# Patient Record
Sex: Male | Born: 1945 | Hispanic: Refuse to answer | Marital: Married | Smoking: Former smoker
Health system: Southern US, Community
[De-identification: ages and names within clinical notes are randomized; demographics above are authoritative.]

## PROBLEM LIST (undated history)

## (undated) DIAGNOSIS — I10 Essential (primary) hypertension: Secondary | ICD-10-CM

## (undated) DIAGNOSIS — E119 Type 2 diabetes mellitus without complications: Secondary | ICD-10-CM

## (undated) HISTORY — PX: CHOLECYSTECTOMY: SHX55

## (undated) HISTORY — DX: Type 2 diabetes mellitus without complications: E11.9

---

## 2015-12-05 ENCOUNTER — Emergency Department (HOSPITAL_COMMUNITY): Payer: Self-pay

## 2015-12-05 ENCOUNTER — Encounter (HOSPITAL_COMMUNITY): Payer: Self-pay

## 2015-12-05 ENCOUNTER — Emergency Department (HOSPITAL_COMMUNITY)
Admission: EM | Admit: 2015-12-05 | Discharge: 2015-12-05 | Disposition: A | Discharge status: Home / Self Care | Payer: Self-pay | Attending: Emergency Medicine | Admitting: Emergency Medicine

## 2015-12-05 DIAGNOSIS — Z87891 Personal history of nicotine dependence: Secondary | ICD-10-CM | POA: Insufficient documentation

## 2015-12-05 DIAGNOSIS — I10 Essential (primary) hypertension: Secondary | ICD-10-CM | POA: Insufficient documentation

## 2015-12-05 NOTE — ED Notes (Signed)
Made Dr. Particia NearingHaviland aware of patient. Verbal order to order head CT. Orders placed.

## 2015-12-05 NOTE — ED Triage Notes (Signed)
Reports of tingling to right side of mouth. STates "it feels like a peppermint candy in the corner of my mouth". Stated today at 1630. States he feels a little off balance.

## 2015-12-05 NOTE — ED Provider Notes (Signed)
[facility]-EM[facility] DEPT Provider Note   CSN: [id number] Arrival date & time: 12/05/15  1930     History   Chief Complaint Chief Complaint  Patient presents with  . Numbness    HPI Tonita PhoenixJehuida Verner is a 70 y.o. male.  Patient complains of a taste appointment candy in the right side of his mouth. He has had hypertension for 40 years without medication. He states he is slightly off balance, but appears to be walking normally. No facial asymmetry, confusion, arm or leg weakness, slurred speech      History reviewed. No pertinent past medical history.  There are no active problems to display for this patient.   Past Surgical History:  Procedure Laterality Date  . CHOLECYSTECTOMY         Home Medications    Prior to Admission medications   Not on File    Family History No family history on file.  Social History Social History  Substance Use Topics  . Smoking status: Former Games developermoker  . Smokeless tobacco: Never Used  . Alcohol use No     Allergies   Review of patient's allergies indicates no known allergies.   Review of Systems Review of Systems  All other systems reviewed and are negative.    Physical Exam Updated Vital Signs BP (!) 170/110 (BP Location: Left Arm)   Pulse 89   Temp 97.8 F (36.6 C) (Oral)   Resp 18   Ht 5\' 8"  (1.727 m)   Wt 175 lb (79.4 kg)   SpO2 98%   BMI 26.61 kg/m   Physical Exam  Constitutional: He is oriented to person, place, and time. He appears well-developed and well-nourished.  HENT:  Head: Normocephalic and atraumatic.  Eyes: Conjunctivae are normal.  Neck: Neck supple.  Cardiovascular: Normal rate and regular rhythm.   Pulmonary/Chest: Effort normal and breath sounds normal.  Abdominal: Soft. Bowel sounds are normal.  Musculoskeletal: Normal range of motion.  Neurological: He is alert and oriented to person, place, and time.  Skin: Skin is warm and dry.  Psychiatric: He has a normal mood and affect. His behavior  is normal.  Nursing note and vitals reviewed.    ED Treatments / Results  Labs (all labs ordered are listed, but only abnormal results are displayed) Labs Reviewed - No data to display  EKG  EKG Interpretation None       Radiology Ct Head Wo Contrast  Result Date: 12/05/2015 CLINICAL DATA:  Acute onset of right-sided mouth tingling. Mild loss of balance. Initial encounter. EXAM: CT HEAD WITHOUT CONTRAST TECHNIQUE: Contiguous axial images were obtained from the base of the skull through the vertex without intravenous contrast. COMPARISON:  None. FINDINGS: Brain: No evidence of acute infarction, hemorrhage, hydrocephalus, extra-axial collection or mass lesion/mass effect. Dense calcification is noted at the basal ganglia bilaterally. Mild periventricular white matter change likely reflects small vessel ischemic microangiopathy. The posterior fossa, including the cerebellum, brainstem and fourth ventricle, is within normal limits. The third and lateral ventricles are unremarkable in appearance. The cerebral hemispheres are symmetric in appearance, with normal gray-white differentiation. No mass effect or midline shift is seen. Vascular: No hyperdense vessel or unexpected calcification. Skull: There is no evidence of fracture; visualized osseous structures are unremarkable in appearance. Sinuses/Orbits: The visualized portions of the orbits are within normal limits. The paranasal sinuses and mastoid air cells are well-aerated. Other: No significant soft tissue abnormalities are seen. IMPRESSION: 1. No acute intracranial pathology seen on CT. 2. Mild small vessel  ischemic microangiopathy. Electronically Signed   By: Roanna RaiderJeffery  Chang M.D.   On: 12/05/2015 20:01    Procedures Procedures (including critical care time)  Medications Ordered in ED Medications - No data to display   Initial Impression / Assessment and Plan / ED Course  I have reviewed the triage vital signs and the nursing  notes.  Pertinent labs & imaging results that were available during my care of the patient were reviewed by me and considered in my medical decision making (see chart for details).  Clinical Course    Patient has normal physical exam. Recommended follow with primary care for his blood pressure. Discussed with patient and his family  Final Clinical Impressions(s) / ED Diagnoses   Final diagnoses:  Hypertension, unspecified type    New Prescriptions New Prescriptions   No medications on file     Donnetta HutchingBrian Natascha Edmonds, MD 12/05/15 2136

## 2015-12-05 NOTE — Discharge Instructions (Signed)
CT scan showed no acute findings. Recommend follow-up with a primary care doctor to monitor your blood pressure.

## 2015-12-05 NOTE — ED Notes (Signed)
To ct

## 2016-03-11 ENCOUNTER — Emergency Department (HOSPITAL_COMMUNITY)
Admission: EM | Admit: 2016-03-11 | Discharge: 2016-03-11 | Disposition: A | Discharge status: Home / Self Care | Payer: Managed Care, Other (non HMO) | Attending: Emergency Medicine | Admitting: Emergency Medicine

## 2016-03-11 ENCOUNTER — Encounter (HOSPITAL_COMMUNITY): Payer: Self-pay | Admitting: Emergency Medicine

## 2016-03-11 DIAGNOSIS — Z87891 Personal history of nicotine dependence: Secondary | ICD-10-CM | POA: Diagnosis not present

## 2016-03-11 DIAGNOSIS — I10 Essential (primary) hypertension: Secondary | ICD-10-CM | POA: Insufficient documentation

## 2016-03-11 DIAGNOSIS — N289 Disorder of kidney and ureter, unspecified: Secondary | ICD-10-CM | POA: Diagnosis not present

## 2016-03-11 DIAGNOSIS — N3001 Acute cystitis with hematuria: Secondary | ICD-10-CM | POA: Insufficient documentation

## 2016-03-11 DIAGNOSIS — R300 Dysuria: Secondary | ICD-10-CM | POA: Diagnosis present

## 2016-03-11 HISTORY — DX: Essential (primary) hypertension: I10

## 2016-03-11 LAB — URINALYSIS, ROUTINE W REFLEX MICROSCOPIC
Bilirubin Urine: NEGATIVE
Glucose, UA: NEGATIVE mg/dL
Ketones, ur: 15 mg/dL — AB
Nitrite: POSITIVE — AB
Protein, ur: 100 mg/dL — AB
Specific Gravity, Urine: 1.02 (ref 1.005–1.030)
pH: 6 (ref 5.0–8.0)

## 2016-03-11 LAB — URINALYSIS, MICROSCOPIC (REFLEX): Squamous Epithelial / LPF: NONE SEEN

## 2016-03-11 LAB — CBC
HCT: 41.9 % (ref 39.0–52.0)
Hemoglobin: 13.7 g/dL (ref 13.0–17.0)
MCH: 27.4 pg (ref 26.0–34.0)
MCHC: 32.7 g/dL (ref 30.0–36.0)
MCV: 83.8 fL (ref 78.0–100.0)
Platelets: 147 10*3/uL — ABNORMAL LOW (ref 150–400)
RBC: 5 MIL/uL (ref 4.22–5.81)
RDW: 14.8 % (ref 11.5–15.5)
WBC: 15.9 10*3/uL — ABNORMAL HIGH (ref 4.0–10.5)

## 2016-03-11 LAB — BASIC METABOLIC PANEL
Anion gap: 7 (ref 5–15)
BUN: 32 mg/dL — ABNORMAL HIGH (ref 6–20)
CO2: 30 mmol/L (ref 22–32)
Calcium: 9.1 mg/dL (ref 8.9–10.3)
Chloride: 99 mmol/L — ABNORMAL LOW (ref 101–111)
Creatinine, Ser: 2.03 mg/dL — ABNORMAL HIGH (ref 0.61–1.24)
GFR calc Af Amer: 36 mL/min — ABNORMAL LOW (ref >60)
GFR calc non Af Amer: 31 mL/min — ABNORMAL LOW (ref >60)
Glucose, Bld: 129 mg/dL — ABNORMAL HIGH (ref 65–99)
Potassium: 4.3 mmol/L (ref 3.5–5.1)
Sodium: 136 mmol/L (ref 135–145)

## 2016-03-11 MED ORDER — LIDOCAINE HCL (PF) 1 % IJ SOLN
INTRAMUSCULAR | Status: AC
  Administered 2016-03-11: 2.1 mL
  Filled 2016-03-11: qty 5

## 2016-03-11 MED ORDER — CEPHALEXIN 500 MG PO CAPS: 500.0000 mg | ORAL_CAPSULE | Freq: Two times a day (BID) | ORAL | 0 refills | Status: DC

## 2016-03-11 MED ORDER — CEFTRIAXONE SODIUM 1 G IJ SOLR
1.0000 g | Freq: Once | INTRAMUSCULAR | Status: AC
  Administered 2016-03-11: 1 g via INTRAMUSCULAR
  Filled 2016-03-11: qty 10

## 2016-03-11 NOTE — ED Triage Notes (Signed)
Patient states blood in urine while voiding and burning sensation. Patient's wife states elevated blood pressure with some altered balance starting yesterday.

## 2016-03-11 NOTE — ED Provider Notes (Signed)
[facility] Provider Note   CSN: [id number] Arrival date & time: 03/11/16  1000  By signing my name below, I, Sonum Patel, attest that this documentation has been prepared under the direction and in the presence of Raeford RazorStephen Lillith Mcneff, MD. Electronically Signed: Sonum Patel, Neurosurgeoncribe. 03/11/16. 11:57 AM.  History   Chief Complaint Chief Complaint  Patient presents with  . Hematuria    The history is provided by the patient. No language interpreter was used.     HPI Comments: Tonita PhoenixJehuida Bache is a 71 y.o. male who presents to the Emergency Department complaining of persistent, unchanged dysuria that has been ongoing for the past few weeks. He reports associated hematuria today and wife notes he was walking as though he was off-balance prior to arrival.  She also reports increased blood pressure for which his wife has given him her own lisinopril. He denies nausea, fatigue.   Past Medical History:  Diagnosis Date  . Hypertension     There are no active problems to display for this patient.   Past Surgical History:  Procedure Laterality Date  . CHOLECYSTECTOMY         Home Medications    Prior to Admission medications   Not on File    Family History No family history on file.  Social History Social History  Substance Use Topics  . Smoking status: Former Games developermoker  . Smokeless tobacco: Never Used  . Alcohol use No     Allergies   Patient has no known allergies.   Review of Systems Review of Systems  A complete 10 system review of systems was obtained and all systems are negative except as noted in the HPI and PMH.   Physical Exam Updated Vital Signs BP 138/77   Pulse 111   Temp 100 F (37.8 C) (Oral)   Resp 20   Ht 5' 8.5" (1.74 m)   Wt 175 lb (79.4 kg)   SpO2 95%   BMI 26.22 kg/m   Physical Exam  Constitutional: He is oriented to person, place, and time. He appears well-developed and well-nourished.  HENT:  Head: Normocephalic and atraumatic.    Eyes: EOM are normal.  Neck: Normal range of motion.  Cardiovascular: Normal rate, regular rhythm, normal heart sounds and intact distal pulses.   Pulmonary/Chest: Effort normal and breath sounds normal. No respiratory distress.  Abdominal: Soft. He exhibits no distension. There is no tenderness. There is no rebound and no guarding.  Musculoskeletal: Normal range of motion.  Neurological: He is alert and oriented to person, place, and time.  Skin: Skin is warm and dry.  Psychiatric: He has a normal mood and affect. Judgment normal.  Nursing note and vitals reviewed.    ED Treatments / Results  DIAGNOSTIC STUDIES: Oxygen Saturation is 95% on RA, adequate by my interpretation.    COORDINATION OF CARE: 11:54 AM Discussed treatment plan with pt at bedside and pt agreed to plan.   Labs (all labs ordered are listed, but only abnormal results are displayed) Labs Reviewed  URINALYSIS, ROUTINE W REFLEX MICROSCOPIC - Abnormal; Notable for the following:       Result Value   APPearance TURBID (*)    Hgb urine dipstick LARGE (*)    Ketones, ur 15 (*)    Protein, ur 100 (*)    Nitrite POSITIVE (*)    Leukocytes, UA LARGE (*)    All other components within normal limits  BASIC METABOLIC PANEL - Abnormal; Notable for the following:  Chloride 99 (*)    Glucose, Bld 129 (*)    BUN 32 (*)    Creatinine, Ser 2.03 (*)    GFR calc non Af Amer 31 (*)    GFR calc Af Amer 36 (*)    All other components within normal limits  CBC - Abnormal; Notable for the following:    WBC 15.9 (*)    Platelets 147 (*)    All other components within normal limits  URINALYSIS, MICROSCOPIC (REFLEX) - Abnormal; Notable for the following:    Bacteria, UA MANY (*)    All other components within normal limits    EKG  EKG Interpretation None       Radiology No results found.  Procedures Procedures (including critical care time)  Medications Ordered in ED Medications - No data to  display   Initial Impression / Assessment and Plan / ED Course  I have reviewed the triage vital signs and the nursing notes.  Pertinent labs & imaging results that were available during my care of the patient were reviewed by me and considered in my medical decision making (see chart for details).      Final Clinical Impressions(s) / ED Diagnoses   Final diagnoses:  Acute cystitis with hematuria  Renal impairment    New Prescriptions New Prescriptions   No medications on file    I personally preformed the services scribed in my presence. The recorded information has been reviewed is accurate. Raeford Razor, MD.    Raeford Razor, MD 03/24/16 1630

## 2016-03-13 LAB — URINE CULTURE: Culture: >=100000 — AB

## 2017-04-26 IMAGING — CT CT HEAD W/O CM
3 series · 15 of 47 positions shown, 18 images · non-contrast
Comparison: None.

CLINICAL DATA: Acute onset of right-sided mouth tingling. Mild loss
of balance. Initial encounter.

EXAM:
CT HEAD WITHOUT CONTRAST
TECHNIQUE: Contiguous axial images were obtained from the base of the skull
through the vertex without intravenous contrast.

[Series 2: head wo · axial · 0.41mm/px · z∈[+57,+182]mm · 9 of 31 slices shown, 12 images]
[im 3/31  brain]
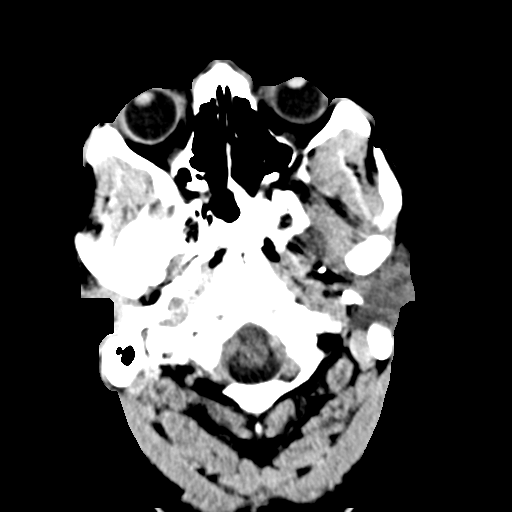
[im 3/31  bone]
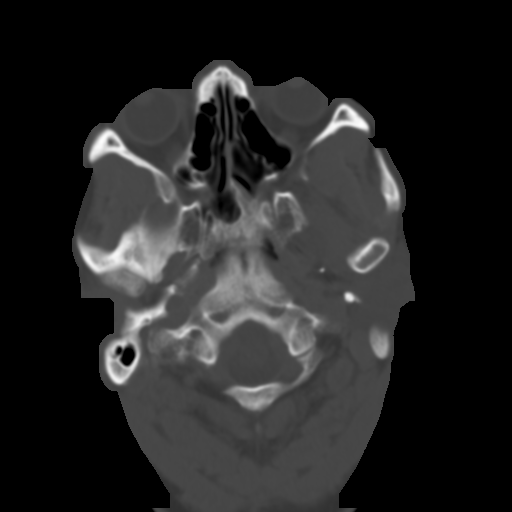
[im 6/31  brain]
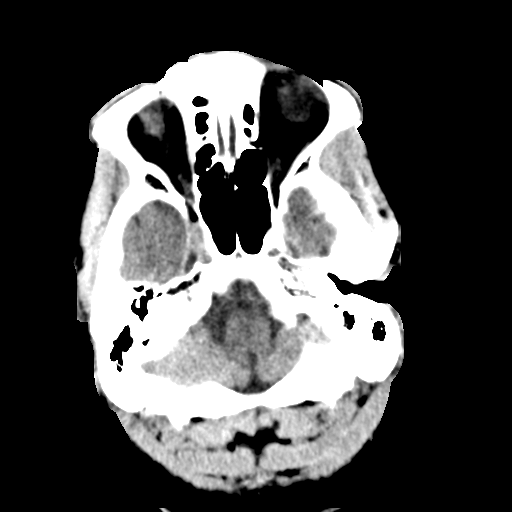
[im 9/31  brain]
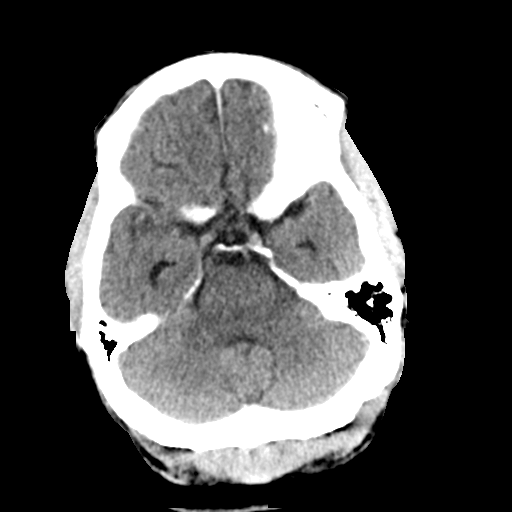
[im 12/31  brain]
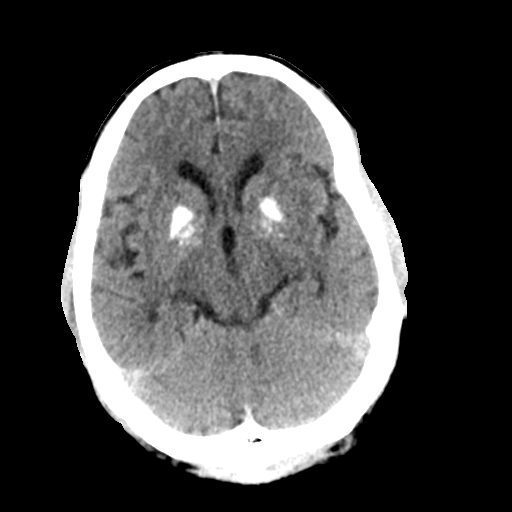
[im 16/31  brain]
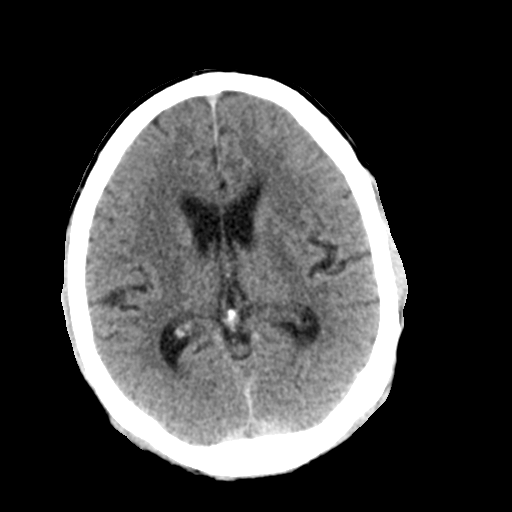
[im 16/31  bone]
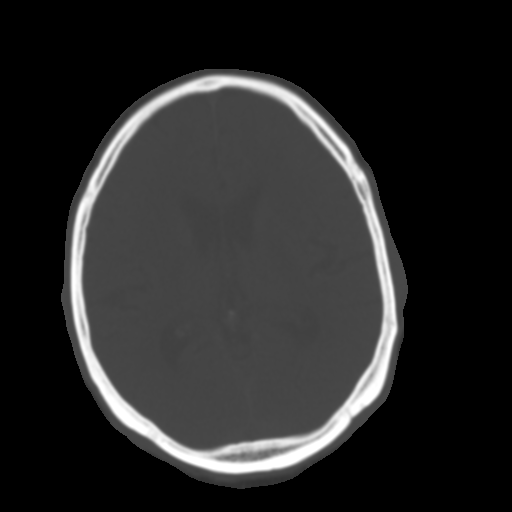
[im 19/31  brain]
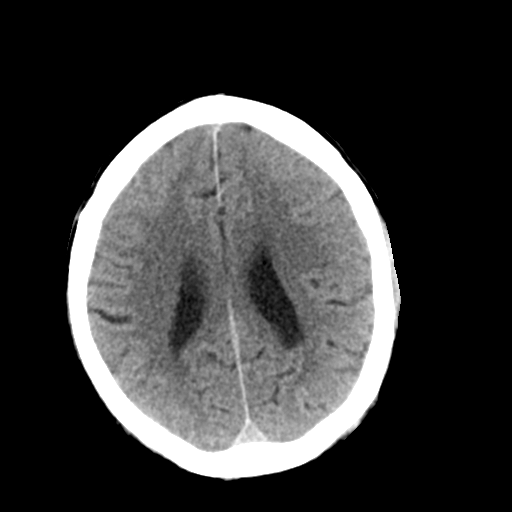
[im 22/31  brain]
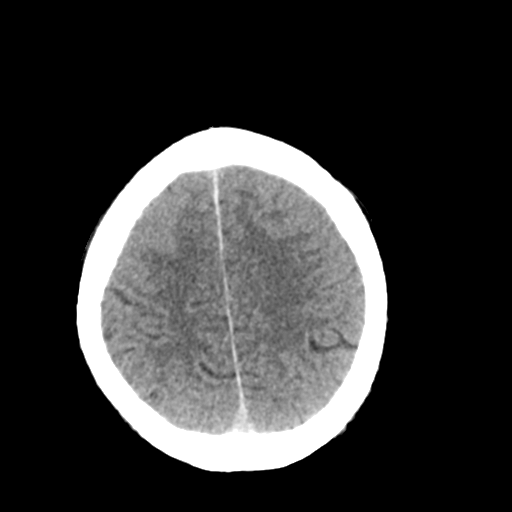
[im 25/31  brain]
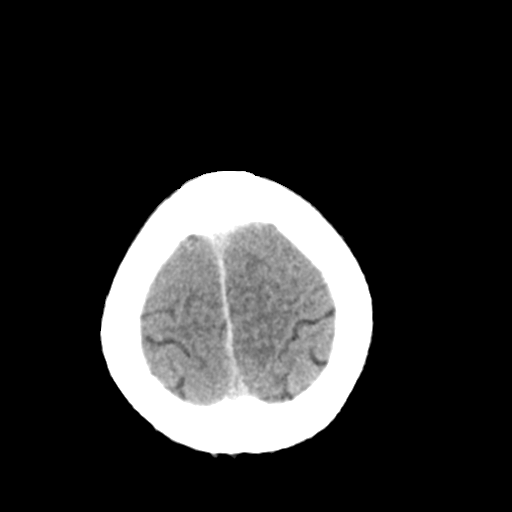
[im 28/31  brain]
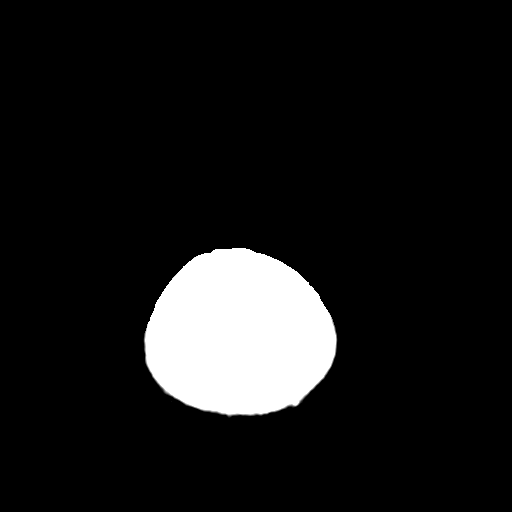
[im 28/31  bone]
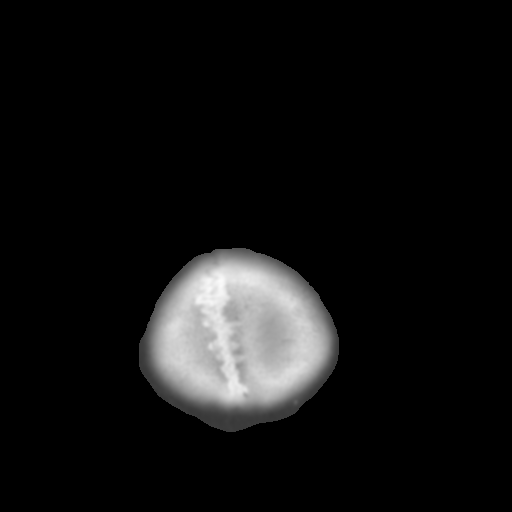

[Series 4: coronal soft tissue · coronal · 0.31mm/px · 3 of 66 slices shown]
[im 22/66  brain]
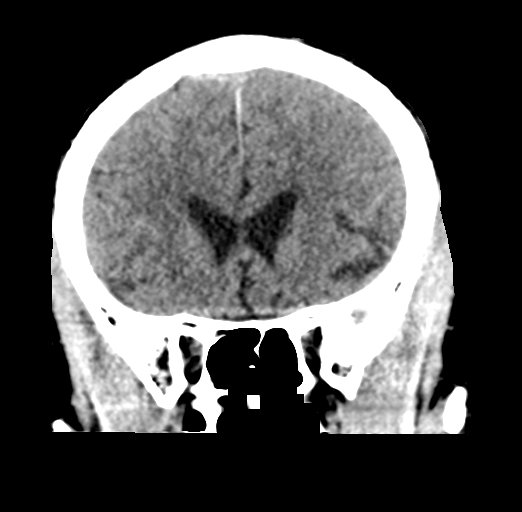
[im 29/66  brain]
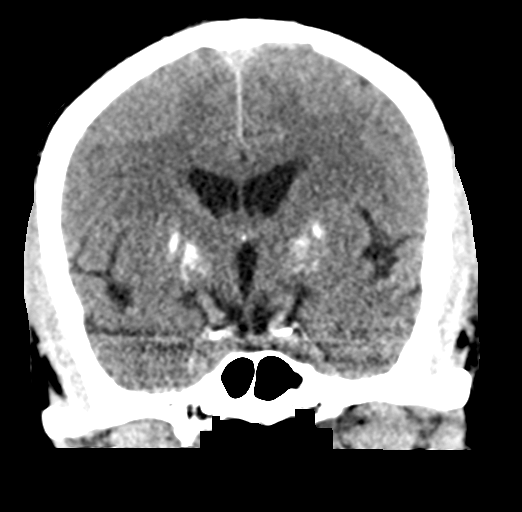
[im 37/66  brain]
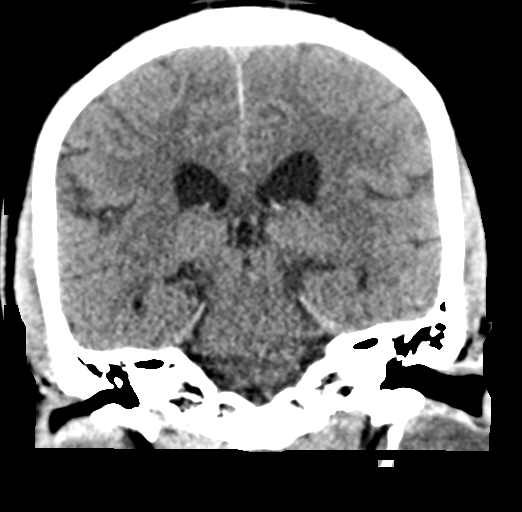

[Series 5: sagittal soft tissue · sagittal · 0.29mm/px · 3 of 54 slices shown]
[im 18/54  brain]
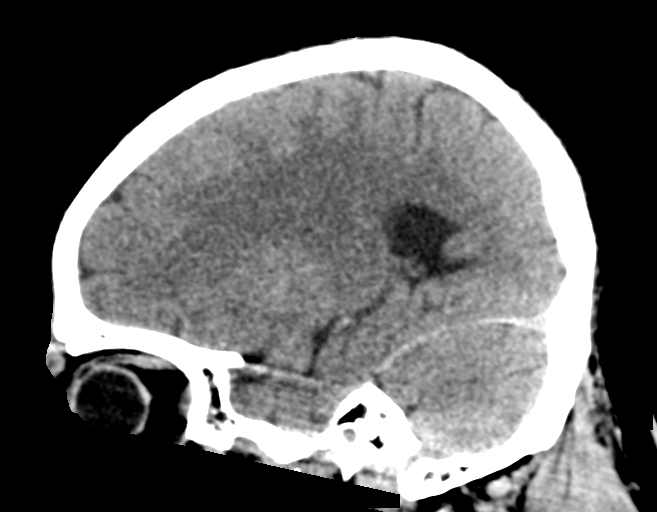
[im 27/54  brain]
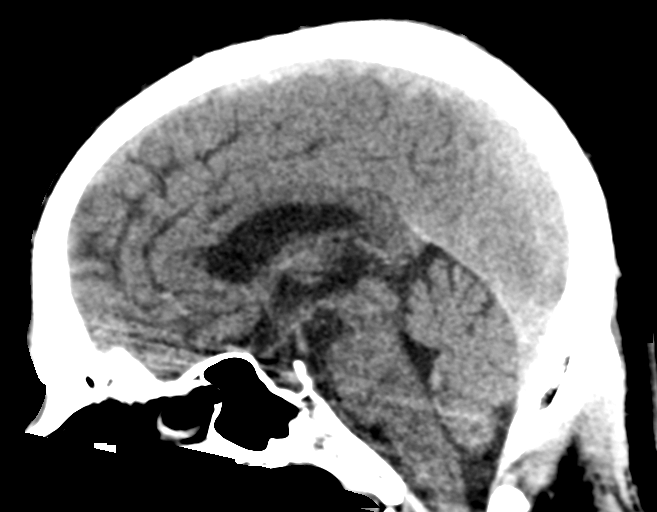
[im 36/54  brain]
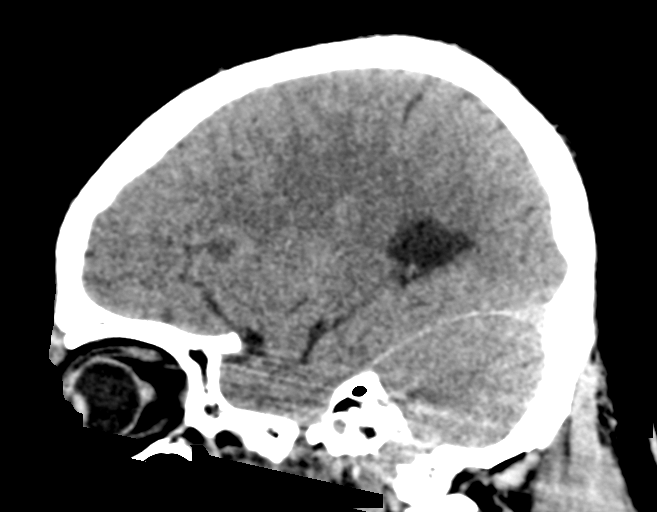

[15 of 47 positions shown; findings below may reference images not displayed]

FINDINGS: Brain: No evidence of acute infarction, hemorrhage, hydrocephalus,
extra-axial collection or mass lesion/mass effect.

Dense calcification is noted at the basal ganglia bilaterally. Mild
periventricular white matter change likely reflects small vessel
ischemic microangiopathy.

The posterior fossa, including the cerebellum, brainstem and fourth
ventricle, is within normal limits. The third and lateral ventricles
are unremarkable in appearance. The cerebral hemispheres are
symmetric in appearance, with normal gray-white differentiation. No
mass effect or midline shift is seen.

Vascular: No hyperdense vessel or unexpected calcification.

Skull: There is no evidence of fracture; visualized osseous
structures are unremarkable in appearance.

Sinuses/Orbits: The visualized portions of the orbits are within
normal limits. The paranasal sinuses and mastoid air cells are
well-aerated.

Other: No significant soft tissue abnormalities are seen.
IMPRESSION: 1. No acute intracranial pathology seen on CT.
2. Mild small vessel ischemic microangiopathy.

## 2018-12-23 ENCOUNTER — Other Ambulatory Visit: Payer: Self-pay

## 2018-12-23 ENCOUNTER — Encounter (HOSPITAL_COMMUNITY): Payer: Self-pay | Admitting: *Deleted

## 2018-12-23 ENCOUNTER — Emergency Department (HOSPITAL_COMMUNITY)
Admission: EM | Admit: 2018-12-23 | Discharge: 2018-12-23 | Disposition: A | Discharge status: Home / Self Care | Payer: Managed Care, Other (non HMO) | Attending: Emergency Medicine | Admitting: Emergency Medicine

## 2018-12-23 DIAGNOSIS — I10 Essential (primary) hypertension: Secondary | ICD-10-CM

## 2018-12-23 DIAGNOSIS — R300 Dysuria: Secondary | ICD-10-CM | POA: Insufficient documentation

## 2018-12-23 DIAGNOSIS — K5900 Constipation, unspecified: Secondary | ICD-10-CM | POA: Insufficient documentation

## 2018-12-23 DIAGNOSIS — Z87891 Personal history of nicotine dependence: Secondary | ICD-10-CM | POA: Insufficient documentation

## 2018-12-23 LAB — URINALYSIS, ROUTINE W REFLEX MICROSCOPIC
Bilirubin Urine: NEGATIVE
Glucose, UA: NEGATIVE mg/dL
Hgb urine dipstick: NEGATIVE
Ketones, ur: NEGATIVE mg/dL
Leukocytes,Ua: NEGATIVE
Nitrite: POSITIVE — AB
Protein, ur: NEGATIVE mg/dL
Specific Gravity, Urine: 1.019 (ref 1.005–1.030)
pH: 5 (ref 5.0–8.0)

## 2018-12-23 MED ORDER — POLYETHYLENE GLYCOL 3350 17 G PO PACK: 17.0000 g | PACK | Freq: Every day | ORAL | 0 refills | Status: AC

## 2018-12-23 MED ORDER — AMLODIPINE BESYLATE 5 MG PO TABS: 5.0000 mg | ORAL_TABLET | Freq: Every day | ORAL | 0 refills | Status: AC

## 2018-12-23 MED ORDER — CEPHALEXIN 500 MG PO CAPS: 500.0000 mg | ORAL_CAPSULE | Freq: Two times a day (BID) | ORAL | 0 refills | Status: AC

## 2018-12-23 MED ORDER — AMLODIPINE BESYLATE 5 MG PO TABS
5.0000 mg | ORAL_TABLET | Freq: Once | ORAL | Status: AC
  Administered 2018-12-23: 5 mg via ORAL
  Filled 2018-12-23: qty 1

## 2018-12-23 NOTE — Discharge Instructions (Signed)
You were given a prescription for antibiotics. Please take the antibiotic prescription fully. A culture was sent of your urine today to determine if there is any bacterial growth. If the results of the culture are positive and you require an antibiotic or a change of your prescribed antibiotic you will be contacted by the hospital. If the results are negative you will not be contacted.  You were started on a medication for your blood pressure. Please take as directed. Please keep a log of your blood pressures at home and take it with you to your primary care appointment when you follow up. Please follow up with your primary care provider within 3-5 days for re-evaluation of your symptoms. If you do not have a primary care provider, information for a healthcare clinic has been provided for you to make arrangements for follow up care. Please return to the emergency department for any new or worsening symptoms including headaches, lightheadedness/dizziness, vision changes, chest pain, shortness of breath.

## 2018-12-23 NOTE — ED Triage Notes (Signed)
LBM today.   Pt send PCP for his urine, states that they took an urine sample

## 2018-12-23 NOTE — ED Provider Notes (Signed)
[facility] EMERGENCY DEPARTMENT Provider Note   CSN: [id number] Arrival date & time: 12/23/18  1612     History   Chief Complaint Chief Complaint  Patient presents with  . Hypertension    HPI Daniel Osborn is a 73 y.o. male.     HPI   Patient is a 73 year old male who presents to the emergency department today for evaluation of hypertension.  He states that he made an appointment with his PCP today for evaluation of dysuria and constipation for the last 2 weeks but when he was seen his blood pressure was elevated and he was sent to the ED.  He denies any headaches, chest pain, shortness of breath, vision changes, dizziness/lightheadedness, numbness or weakness.  He also reports dysuria and hard stools.  States he has not had a full bowel movement for about 2 weeks.  His last BM was this morning.  He denies abdominal pain nausea or vomiting.  He has been eating prunes which has helped a little bit.  He is also been taking his wife's Pyridium for his dysuria but his symptoms have persisted.  Past Medical History:  Diagnosis Date  . Hypertension     There are no active problems to display for this patient.   Past Surgical History:  Procedure Laterality Date  . CHOLECYSTECTOMY          Home Medications    Prior to Admission medications   Medication Sig Start Date End Date Taking? Authorizing Provider  amLODipine (NORVASC) 5 MG tablet Take 1 tablet (5 mg total) by mouth daily. 12/23/18   Dawson Albers S, PA-C  cephALEXin (KEFLEX) 500 MG capsule Take 1 capsule (500 mg total) by mouth 2 (two) times daily for 7 days. 12/23/18 12/30/18  Elton Catalano S, PA-C  polyethylene glycol (MIRALAX) 17 g packet Take 17 g by mouth daily. Dissolve one cap full in solution (water, gatorade, etc.) and administer once cap-full daily. You may titrate up daily by 1 cap-full until the patient is having pudding consistency of stools. After the patient is able to start passing softer stools  they will need to be on 1/2 cap-full daily for 2 weeks. 12/23/18   Lytle Malburg S, PA-C    Family History History reviewed. No pertinent family history.  Social History Social History   Tobacco Use  . Smoking status: Former Games developer  . Smokeless tobacco: Never Used  Substance Use Topics  . Alcohol use: No  . Drug use: No     Allergies   Patient has no known allergies.   Review of Systems Review of Systems  Constitutional: Negative for fever.  HENT: Negative for ear pain and sore throat.   Eyes: Negative for visual disturbance.  Respiratory: Negative for cough and shortness of breath.   Cardiovascular: Negative for chest pain.  Gastrointestinal: Positive for constipation. Negative for abdominal pain, diarrhea, nausea and vomiting.  Genitourinary: Negative for dysuria and hematuria.  Musculoskeletal: Negative for back pain.  Skin: Negative for color change and rash.  Neurological: Negative for headaches.  All other systems reviewed and are negative.    Physical Exam Updated Vital Signs BP (!) 188/106 (BP Location: Right Arm)   Pulse 88   Temp (!) 97.4 F (36.3 C) (Temporal)   Resp 14   Ht 5\' 8"  (1.727 m)   Wt 84.3 kg   SpO2 97%   BMI 28.25 kg/m   Physical Exam Vitals signs and nursing note reviewed.  Constitutional:  Appearance: He is well-developed.  HENT:     Head: Normocephalic and atraumatic.  Eyes:     Conjunctiva/sclera: Conjunctivae normal.  Neck:     Musculoskeletal: Neck supple.  Cardiovascular:     Rate and Rhythm: Normal rate and regular rhythm.     Pulses: Normal pulses.     Heart sounds: Normal heart sounds. No murmur.  Pulmonary:     Effort: Pulmonary effort is normal. No respiratory distress.     Breath sounds: Normal breath sounds. No wheezing, rhonchi or rales.  Abdominal:     General: Bowel sounds are normal. There is no distension.     Palpations: Abdomen is soft.     Tenderness: There is no abdominal tenderness. There is no  guarding or rebound.  Skin:    General: Skin is warm and dry.  Neurological:     Mental Status: He is alert.    ED Treatments / Results  Labs (all labs ordered are listed, but only abnormal results are displayed) Labs Reviewed  URINALYSIS, ROUTINE W REFLEX MICROSCOPIC - Abnormal; Notable for the following components:      Result Value   Color, Urine AMBER (*)    Nitrite POSITIVE (*)    Bacteria, UA RARE (*)    All other components within normal limits  URINE CULTURE    EKG None  Date: 12/23/2018  Rate: 79   Rhythm: normal sinus rhythm  QRS Axis: normal  Intervals: normal  ST/T Wave abnormalities: twave inversions inferiorly and laterally  Conduction Disutrbances: 1st degree AV block  Narrative Interpretation: LVH  Old EKG Reviewed: no prior for comparison     Radiology No results found.  Procedures Procedures (including critical care time)  Medications Ordered in ED Medications  amLODipine (NORVASC) tablet 5 mg (5 mg Oral Given 12/23/18 2154)     Initial Impression / Assessment and Plan / ED Course  I have reviewed the triage vital signs and the nursing notes.  Pertinent labs & imaging results that were available during my care of the patient were reviewed by me and considered in my medical decision making (see chart for details).     Final Clinical Impressions(s) / ED Diagnoses   Final diagnoses:  Hypertension, unspecified type  Dysuria  Constipation, unspecified constipation type   72 year old male presenting for evaluation of hypertension.  Was seen by PCP prior to arrival for dysuria and constipation but was sent to the ED due to elevated blood pressure.  UA with nitrites, no leukocytes.  0-5 RBCs, 0-5 WBCs and rare bacteria.  Urine culture was sent. - tx with keflex. -Reviewed prior cultures.  Patient has history of pansensitive E. coli.  EKG without STEMI. No chest pain.   Patient's abdomen is soft and nontender.  I have low suspicion for  emergent intraabdominal pathology that would require further imaging at this time.  I will start him on MiraLAX for his constipation  With regard to HTN, pt is asymptomatic in the ED, therefor does not meet criteria for hypertensive emergency. I gave a dose of amlodipine. Discussed BP monitoring at home and diet/exercise. Advised the he will need to have labs with his pcp. Advised on specific return. Pt  precautions.  He voices understanding and is in agreement plan.  All questions answered.  Patient stable for discharge.  Pt seen in conjunction with Dr. Lacinda Axon, supervising physician who personally evaluated this patient and is in agreement with the plan.   ED Discharge Orders  Ordered    cephALEXin (KEFLEX) 500 MG capsule  2 times daily     12/23/18 2201    amLODipine (NORVASC) 5 MG tablet  Daily     12/23/18 2201    polyethylene glycol (MIRALAX) 17 g packet  Daily     12/23/18 2202           Karrie MeresCouture, Kameria Canizares S, PA-C 12/23/18 2240    Donnetta Hutchingook, Brian, MD 12/26/18 2055

## 2018-12-23 NOTE — ED Triage Notes (Signed)
Pt sent here for HTN, pt denies hx or taking medication for it.  Pt denies any pain.

## 2018-12-25 LAB — URINE CULTURE: Culture: NO GROWTH

## 2020-02-23 ENCOUNTER — Ambulatory Visit: Payer: Self-pay | Admitting: Urology

## 2020-02-23 DIAGNOSIS — R300 Dysuria: Secondary | ICD-10-CM

## 2020-07-23 NOTE — Congregational Nurse Program (Signed)
Pt/wife came to [facility] to receive assistance with getting established with dentist referral.    Pt was referred to [facility] Program for enrollment.    Pt current medical needs are related need for dental health

## 2021-09-09 ENCOUNTER — Telehealth: Payer: Self-pay

## 2021-09-09 NOTE — Telephone Encounter (Signed)
Attempted call to follow up from message from wife. Client was seen today in [facility] and diagnosed with Covid 19 and was prescribed medication. She wanted to know the most affordable place to fill. No answer, left VM to return call. Note: cannot check [facility] for this medication as it says no pharmacy has with them. [facility] to give client's wife best information.  Will await call.  Francee Nodal RN [employer]

## 2021-09-10 ENCOUNTER — Telehealth: Payer: Self-pay

## 2021-09-10 NOTE — Telephone Encounter (Signed)
Called Mr Milstein's wife Kendal Hymen to follow up regarding medication called in for her husband from [facility], [facility], [facility].  She states they called in Paxlovid to [place] and there was no charge. She is thankful for the follow up.  Francee Nodal RN [facility]

## 2021-11-12 LAB — LAB REPORT - SCANNED
A1c: 6.7
EGFR: 50

## 2022-01-06 ENCOUNTER — Ambulatory Visit: Payer: Self-pay | Admitting: Dietician

## 2022-04-21 ENCOUNTER — Ambulatory Visit: Payer: Self-pay | Admitting: Dietician

## 2022-04-23 NOTE — Congregational Nurse Program (Signed)
Pt and wife came to [facility] to receive assistance with obtaining his medication that he ran out of on 1-2 days prior to  States his Xarelto medication is unaffordable and wants to know how to handle getting the medication or just knowing what he needs to do.  Pt  states they went to DSS of [place] and applied for Medicaid prior to attending the [employer] on today with hopes that he may be able to apply for additional  healthcare via Medicaid as where he states he only has Medicare Pare A for hospital coverage.   He is currently unable to be approved with for [facility] by way of [facility] Connect due to also being denied because of having Part A Medicare   Plan   -Pt was advised to contact his PCP while in the clinic and speak to [facility] -PCP office regarding the medication cost and seeing if there is a replacement medication for Xarelto that is more affordable.  Pt/wife was successfully assisted by me and was able to  contact the PCP front office to get them to forward a message directly to the provider to address this matter  - Pt contacted also decided to contact a relative who was a pharmacist who provided advice over phone to pt and wife while in clinic on how to get connected with the Eliquis drug manufacturer whom they may can get a drug assistance card for Eliquis versus Xarelto once the pt contact his provider approve, if they see applicable.  The relative assisted them to apply on on-line and was going to forward the card to the pt that could be used once the [facility] provider decides to approve switching to this more affordable medication       -Pt was also referred to June Coleson at [facility]- Medicare program) due to the pt stating he only has Medicare Part A without any other additonal Medical or drug coverages.    Contact information was provided to the pt and his wife.  They have an appointment for Tues Mar 19 at 3:00pm

## 2022-06-13 ENCOUNTER — Encounter: Payer: Self-pay | Attending: Sports Medicine | Admitting: Dietician

## 2022-06-13 ENCOUNTER — Encounter: Payer: Self-pay | Admitting: Dietician

## 2022-06-13 VITALS — Ht 66.5 in | Wt 199.0 lb

## 2022-06-13 DIAGNOSIS — E119 Type 2 diabetes mellitus without complications: Secondary | ICD-10-CM | POA: Insufficient documentation

## 2022-06-13 NOTE — Progress Notes (Unsigned)
Diabetes Self-Management Education  Visit Type: First/Initial  Appt. Start Time: 1450 Appt. End Time: 1615  06/16/2022  [name], identified by name and date of birth, is a 77 y.o. male with a diagnosis of Diabetes: Type 2.   ASSESSMENT Patient is here today with his wife.  Referral:  Type 2 Diabetes (new), HLD, CKD  Type 2 Diabetes, CKD, HLD, h/o prostate cancer s/p radiation therapy, blood clots, vision impairment, HTN Medications include:  iron, eliquis Labs noted to include:  A1C 6.7% 06/05/2022 and 11/11/2021, cholesterol 201, triglycerides 451, HDL 32, on 11/11/2021, BUN 28, Creatinine 1.64, Potassium 3.4, eGFR 43 on 06/05/2022  Weight hx: 66.5" 199 lb 06/13/2022 204 lbs  180 lbs 2021  Patient lives with his wife.  She is a retired Engineer, civil (consulting).   They eat out frequently. Patient does most of the cooking.   Patient is from the [place].  6th grade education.  Retired Biomedical scientist.  Height 5' 6.5" (1.689 m), weight 199 lb (90.3 kg). Body mass index is 31.64 kg/m.   Diabetes Self-Management Education - 06/13/22 1514       Visit Information   Visit Type First/Initial      Initial Visit   Diabetes Type Type 2    Date Diagnosed 11/2021    Are you currently following a meal plan? No    Are you taking your medications as prescribed? Not on Medications      Health Coping   How would you rate your overall health? Good      Psychosocial Assessment   Patient Belief/Attitude about Diabetes Motivated to manage diabetes    Self-care barriers Debilitated state due to current medical condition;Impaired vision    Self-management support Doctor's office;Family    Other persons present Patient;Spouse/SO    Patient Concerns Nutrition/Meal planning;Glycemic Control;Healthy Lifestyle    Special Needs Verbal instruction;Instruct caregiver    Preferred Learning Style No preference indicated    Learning Readiness Ready    How often do you need to have someone help you when you  read instructions, pamphlets, or other written materials from your doctor or pharmacy? 1 - Never    What is the last grade level you completed in school? 6      Pre-Education Assessment   Patient understands the diabetes disease and treatment process. Needs Instruction    Patient understands incorporating nutritional management into lifestyle. Needs Instruction    Patient undertands incorporating physical activity into lifestyle. Needs Instruction    Patient understands using medications safely. Needs Instruction    Patient understands monitoring blood glucose, interpreting and using results Needs Instruction    Patient understands prevention, detection, and treatment of acute complications. Needs Instruction    Patient understands prevention, detection, and treatment of chronic complications. Needs Instruction    Patient understands how to develop strategies to address psychosocial issues. Needs Instruction    Patient understands how to develop strategies to promote health/change behavior. Needs Instruction      Complications   Last HgB A1C per patient/outside source 6.7 %   06/05/2022   How often do you check your blood sugar? Not recommended by provider    Have you had a dilated eye exam in the past 12 months? Yes    Have you had a dental exam in the past 12 months? No    Are you checking your feet? Yes    How many days per week are you checking your feet? 4      Dietary  Intake   Breakfast Oatmeal with 4-5 tsp sugar, cream, banana    Snack (morning) none    Snack (afternoon) occasional cheese puffs    Dinner rotissorie chicken, lettuce, ranch,    Snack (evening) occasional leftovers "all night"    Beverage(s) water, coconut water, sweet tea (1), occasional juice      Activity / Exercise   Activity / Exercise Type ADL's      Patient Education   Previous Diabetes Education Yes (please comment)    Disease Pathophysiology Definition of diabetes, type 1 and 2, and the diagnosis of  diabetes    Healthy Eating Role of diet in the treatment of diabetes and the relationship between the three main macronutrients and blood glucose level;Plate Method;Food label reading, portion sizes and measuring food.;Meal options for control of blood glucose level and chronic complications.    Being Active Role of exercise on diabetes management, blood pressure control and cardiac health.    Monitoring Yearly dilated eye exam;Daily foot exams;Identified appropriate SMBG and/or A1C goals.;Purpose and frequency of SMBG.    Acute complications Taught prevention, symptoms, and  treatment of hypoglycemia - the 15 rule.;Discussed and identified patients' prevention, symptoms, and treatment of hyperglycemia.    Chronic complications Relationship between chronic complications and blood glucose control    Diabetes Stress and Support Identified and addressed patients feelings and concerns about diabetes;Worked with patient to identify barriers to care and solutions;Role of stress on diabetes    Lifestyle and Health Coping Lifestyle issues that need to be addressed for better diabetes care      Individualized Goals (developed by patient)   Nutrition General guidelines for healthy choices and portions discussed    Physical Activity Exercise 5-7 days per week;30 minutes per day    Medications Not Applicable    Problem Solving Eating Pattern    Reducing Risk do foot checks daily      Post-Education Assessment   Patient understands the diabetes disease and treatment process. Comprehends key points    Patient understands incorporating nutritional management into lifestyle. Needs Review    Patient undertands incorporating physical activity into lifestyle. Comprehends key points    Patient understands using medications safely. Comphrehends key points    Patient understands monitoring blood glucose, interpreting and using results Comprehends key points    Patient understands prevention, detection, and treatment  of acute complications. Comprehends key points    Patient understands prevention, detection, and treatment of chronic complications. Comprehends key points    Patient understands how to develop strategies to address psychosocial issues. Comprehends key points    Patient understands how to develop strategies to promote health/change behavior. Needs Review      Outcomes   Expected Outcomes Demonstrated interest in learning. Expect positive outcomes    Future DMSE 2 months    Program Status Not Completed             Individualized Plan for Diabetes Self-Management Training:   Learning Objective:  Patient will have a greater understanding of diabetes self-management. Patient education plan is to attend individual and/or group sessions per assessed needs and concerns.   Plan:   Patient Instructions  Avoid skipping meals  Breakfast, lunch, and dinner daily.  Small amounts of meat  Eat more vegetables  Avoid/or Less added sugar. Avoid added salt and choose low sodium choices  [employer]  Silver Palate Low sodium pasta sauce   Eat more Non-Starchy Vegetables These include greens, broccoli, cauliflower, cabbage, carrots, beets, eggplant,  peppers, squash and others. Minimize added sugars and refined grains Rethink what you drink.  Choose beverages without added sugar.  Look for 0 carbs on the label. See the list of whole grains below.  Find alternatives to usual sweet treats. Choose whole foods over processed. Make simple meals at home more often than eating out.  Tips to increase fiber in your diet: (All plants have fiber.  Eat a variety. There are more than are on this list.) Slowly increase the amount of fiber you eat to 25-35 grams per day.  (More is fine if you tolerate it.) Fiber from whole grains, nuts and seeds Quinoa, 1/2 cup = 5 grams Bulgur, 1/2 cup = 4.1 grams Popcorn, 3 cups = 3.6 grams Whole Wheat Spaghetti, 1/2 cup = 3.2 grams Barley, 1/2 cup =  3 grams Oatmeal, 1/2 cup = 2 grams Whole Wheat English Muffin = 3 grams Corn, 1/2 cup = 2.1 grams Brown Rice, 1/2 cup = 1.8 grams Flax seeds, 1 Tablespoon = 2.8 grams Chia seeds, 1 Tablespoon = 11 grams Almonds, 1 ounce = 3.5 grams fiber Fiber from legumes Kidney beans, 1/2 cup 7.9 grams Lentils, 1/2 cup = 7.8 grams Pinto beans, 1/2 cup = 7.7 grams Black beans, 1/2 cup = 7.6 grams Lima beans, 1/2 cup 6.4 grams Chick peas, 1/2 cup = 5.3 grams Black eyed peas, 1/2 cup = 4 grams Fiber from fruits and vegetables Pear, 6 grams Apple. 3.3 grams Raspberries or Blackberries, 3/4 cup = 6 grams Strawberries or Blueberries, 1 cup = 3.4 grams Baked sweet potato 3.8 grams fiber Baked potato with skin 4.4 grams  Peas, 1/2 cup = 4.4 grams  Spinach, 1/2 cup cooked = 3.5 grams  Avocado, 1/2 = 5 grams      Expected Outcomes:  Demonstrated interest in learning. Expect positive outcomes  Education material provided: ADA - How to Thrive: A Guide for Your Journey with Diabetes, Food label handouts, Meal plan card, Snack sheet, and Diabetes Resources, [facility] [employer] of Lifestyle Medicine) packet  If problems or questions, patient to contact team via:  Phone  Future DSME appointment: 2 months

## 2022-06-13 NOTE — Patient Instructions (Addendum)
Avoid skipping meals  Breakfast, lunch, and dinner daily.  Small amounts of meat  Eat more vegetables  Avoid/or Less added sugar. Avoid added salt and choose low sodium choices  [employer]  Silver Palate Low sodium pasta sauce   Eat more Non-Starchy Vegetables These include greens, broccoli, cauliflower, cabbage, carrots, beets, eggplant, peppers, squash and others. Minimize added sugars and refined grains Rethink what you drink.  Choose beverages without added sugar.  Look for 0 carbs on the label. See the list of whole grains below.  Find alternatives to usual sweet treats. Choose whole foods over processed. Make simple meals at home more often than eating out.  Tips to increase fiber in your diet: (All plants have fiber.  Eat a variety. There are more than are on this list.) Slowly increase the amount of fiber you eat to 25-35 grams per day.  (More is fine if you tolerate it.) Fiber from whole grains, nuts and seeds Quinoa, 1/2 cup = 5 grams Bulgur, 1/2 cup = 4.1 grams Popcorn, 3 cups = 3.6 grams Whole Wheat Spaghetti, 1/2 cup = 3.2 grams Barley, 1/2 cup = 3 grams Oatmeal, 1/2 cup = 2 grams Whole Wheat English Muffin = 3 grams Corn, 1/2 cup = 2.1 grams Brown Rice, 1/2 cup = 1.8 grams Flax seeds, 1 Tablespoon = 2.8 grams Chia seeds, 1 Tablespoon = 11 grams Almonds, 1 ounce = 3.5 grams fiber Fiber from legumes Kidney beans, 1/2 cup 7.9 grams Lentils, 1/2 cup = 7.8 grams Pinto beans, 1/2 cup = 7.7 grams Black beans, 1/2 cup = 7.6 grams Lima beans, 1/2 cup 6.4 grams Chick peas, 1/2 cup = 5.3 grams Black eyed peas, 1/2 cup = 4 grams Fiber from fruits and vegetables Pear, 6 grams Apple. 3.3 grams Raspberries or Blackberries, 3/4 cup = 6 grams Strawberries or Blueberries, 1 cup = 3.4 grams Baked sweet potato 3.8 grams fiber Baked potato with skin 4.4 grams  Peas, 1/2 cup = 4.4 grams  Spinach, 1/2 cup cooked = 3.5 grams  Avocado, 1/2 = 5  grams

## 2022-08-28 ENCOUNTER — Ambulatory Visit: Payer: Self-pay | Admitting: Dietician
